# Patient Record
Sex: Female | Born: 1995 | Race: Black or African American | Hispanic: No | Marital: Single | Smoking: Never smoker
Health system: Southern US, Community
[De-identification: ages and names within clinical notes are randomized; demographics above are authoritative.]

---

## 2019-09-08 ENCOUNTER — Other Ambulatory Visit: Payer: Self-pay

## 2019-09-08 ENCOUNTER — Ambulatory Visit
Admission: EM | Admit: 2019-09-08 | Discharge: 2019-09-08 | Disposition: A | Discharge status: Home / Self Care | Payer: Medicaid Other | Attending: Family Medicine | Admitting: Family Medicine

## 2019-09-08 ENCOUNTER — Ambulatory Visit (INDEPENDENT_AMBULATORY_CARE_PROVIDER_SITE_OTHER): Payer: Medicaid Other

## 2019-09-08 DIAGNOSIS — S29012A Strain of muscle and tendon of back wall of thorax, initial encounter: Secondary | ICD-10-CM | POA: Diagnosis not present

## 2019-09-08 DIAGNOSIS — S39012A Strain of muscle, fascia and tendon of lower back, initial encounter: Secondary | ICD-10-CM

## 2019-09-08 MED ORDER — MELOXICAM 15 MG PO TABS
15.0000 mg | ORAL_TABLET | Freq: Every day | ORAL | 0 refills | Status: DC | PRN
Start: 2019-09-08 — End: 2019-11-02

## 2019-09-08 MED ORDER — KETOROLAC TROMETHAMINE 60 MG/2ML IM SOLN
60.0000 mg | Freq: Once | INTRAMUSCULAR | Status: AC
  Administered 2019-09-08: 60 mg via INTRAMUSCULAR

## 2019-09-08 MED ORDER — CYCLOBENZAPRINE HCL 10 MG PO TABS
10.0000 mg | ORAL_TABLET | Freq: Two times a day (BID) | ORAL | 0 refills | Status: DC | PRN
Start: 2019-09-08 — End: 2019-11-02
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
## 2019-09-08 NOTE — Discharge Instructions (Signed)
Take medication as prescribed. Rest. Drink plenty of fluids. Ice/Heat. Stretch.   Follow up with your primary care physician this week as needed. Return to Urgent care for new or worsening concerns.

## 2019-11-02 ENCOUNTER — Other Ambulatory Visit: Payer: Self-pay

## 2019-11-02 ENCOUNTER — Ambulatory Visit
Admission: EM | Admit: 2019-11-02 | Discharge: 2019-11-02 | Disposition: A | Discharge status: Home / Self Care | Payer: Medicaid Other | Attending: Emergency Medicine | Admitting: Emergency Medicine

## 2019-11-02 ENCOUNTER — Encounter: Payer: Self-pay | Admitting: Emergency Medicine

## 2019-11-02 DIAGNOSIS — H8112 Benign paroxysmal vertigo, left ear: Secondary | ICD-10-CM

## 2019-11-02 MED ORDER — MECLIZINE HCL 25 MG PO TABS
25.0000 mg | ORAL_TABLET | Freq: Three times a day (TID) | ORAL | 0 refills | Status: AC | PRN
Start: 2019-11-02 — End: ?

## 2019-11-02 NOTE — Discharge Instructions (Addendum)
Take meclizine as needed.  Epley maneuver on the left.

## 2019-11-02 NOTE — ED Triage Notes (Signed)
Pt c/o dizziness and nausea.. She states dizziness is positional and occurs worse when she turns on her left side. She states she feels like the room is spinning.   Started about 3 am this morning.

## 2019-11-02 NOTE — ED Provider Notes (Signed)
HPI  SUBJECTIVE:  Allison Meadows is a 24 y.o. female who presents with vertigo described as the room spinning around her starting at 2 AM this morning.  it has lasted hours.,  But patient states it is getting better.  She states that she "feels drunk".  She reports nausea, one episode of emesis,, bilateral ear pressure left more so than the right.  No fevers, ear pain.  Mild headache.  No arm or leg weakness, facial droop, slurred speech, recent viral illness.  No head trauma, syncope, seizures.  She does not take any medications other than supplemental iron on a regular basis.  No recent antibiotisc, ototoxic medications.  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  She tried sitting upright.  Symptoms are better with lying on her right side, symptoms are worse with lying her left side and turning her head to the left.  Past medical history negative for vertigo, diabetes, hypertension, aneurysm, stroke, Mnire's disease atrial fibrillation, carotid artery disease.  LMP: 9/1.  She denies the possibility of being pregnant.  She is breast-feeding.  PMD: Dr. Rema Fendt   History reviewed. No pertinent past medical history.  Past Surgical History:  Procedure Laterality Date  . CESAREAN SECTION  10/2018    Family History  Problem Relation Age of Onset  . Healthy Mother   . Healthy Father     Social History   Tobacco Use  . Smoking status: Never Smoker  . Smokeless tobacco: Never Used  Vaping Use  . Vaping Use: Never used  Substance Use Topics  . Alcohol use: Yes    Comment: occasionally  . Drug use: Never    No current facility-administered medications for this encounter.  Current Outpatient Medications:  .  ferrous sulfate 325 (65 FE) MG EC tablet, Take 325 mg by mouth 3 (three) times daily with meals., Disp: , Rfl:  .  meclizine (ANTIVERT) 25 MG tablet, Take 1 tablet (25 mg total) by mouth 3 (three) times daily as needed for dizziness., Disp: 30 tablet, Rfl: 0  Allergies  Allergen  Reactions  . Cherry Anaphylaxis  . Cherry Flavor     Hives      ROS  As noted in HPI.   Physical Exam  BP 132/83 (BP Location: Left Arm)   Pulse 67   Temp 98.3 F (36.8 C) (Oral)   Resp 18   Ht 5\' 2"  (1.575 m)   Wt 104.3 kg   LMP 10/20/2019 (Approximate)   SpO2 100%   BMI 42.06 kg/m   Constitutional: Well developed, well nourished, no acute distress Eyes:  EOMI, conjunctiva normal bilaterally PERRLA, no photophobia HENT: Normocephalic, atraumatic,mucus membranes moist.  TMs normal bilaterally Respiratory: Normal inspiratory effort Cardiovascular: Normal rate regular rhythm no murmurs rubs or gallops.  No carotid bruit. GI: nondistended skin: No rash, skin intact Musculoskeletal: no deformities Neurologic: Alert & oriented x 3, cranial nerves III through XII intact, finger/nose heel/shin intact.  Tandem gait steady.  Romberg negative.  Positive Dix-Hallpike on the left. Psychiatric: Speech and behavior appropriate   ED Course   Medications - No data to display  No orders of the defined types were placed in this encounter.   No results found for this or any previous visit (from the past 24 hour(s)). No results found.  ED Clinical Impression  1. Benign paroxysmal positional vertigo of left ear      ED Assessment/Plan  Presentation consistent with BPPV.  Home with meclizine, Epley maneuver on the  left.  Follow-up with ENT at her work if no better in 2 to 3 days.  ER return precautions given.  Discussed  MDM, treatment plan, and plan for follow-up with patient. Discussed sn/sx that should prompt return to the ED. patient agrees with plan.   Meds ordered this encounter  Medications  . meclizine (ANTIVERT) 25 MG tablet    Sig: Take 1 tablet (25 mg total) by mouth 3 (three) times daily as needed for dizziness.    Dispense:  30 tablet    Refill:  0    *This clinic note was created using Scientist, clinical (histocompatibility and immunogenetics). Therefore, there may be occasional mistakes  despite careful proofreading.   ?    Domenick Gong, MD 11/03/19 [phone]
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
